# Patient Record
Sex: Female | Born: 1960 | Race: White | Hispanic: Yes | Marital: Married | Smoking: Current every day smoker
Health system: Southern US, Community
[De-identification: ages and names within clinical notes are randomized; demographics above are authoritative.]

## PROBLEM LIST (undated history)

## (undated) DIAGNOSIS — M1990 Unspecified osteoarthritis, unspecified site: Secondary | ICD-10-CM

## (undated) DIAGNOSIS — T7840XA Allergy, unspecified, initial encounter: Secondary | ICD-10-CM

## (undated) DIAGNOSIS — M8580 Other specified disorders of bone density and structure, unspecified site: Secondary | ICD-10-CM

## (undated) DIAGNOSIS — K219 Gastro-esophageal reflux disease without esophagitis: Secondary | ICD-10-CM

## (undated) DIAGNOSIS — K635 Polyp of colon: Secondary | ICD-10-CM

## (undated) DIAGNOSIS — K589 Irritable bowel syndrome without diarrhea: Secondary | ICD-10-CM

## (undated) HISTORY — DX: Gastro-esophageal reflux disease without esophagitis: K21.9

## (undated) HISTORY — DX: Polyp of colon: K63.5

## (undated) HISTORY — PX: WISDOM TOOTH EXTRACTION: SHX21

## (undated) HISTORY — DX: Other specified disorders of bone density and structure, unspecified site: M85.80

## (undated) HISTORY — DX: Unspecified osteoarthritis, unspecified site: M19.90

## (undated) HISTORY — DX: Allergy, unspecified, initial encounter: T78.40XA

## (undated) HISTORY — DX: Irritable bowel syndrome, unspecified: K58.9

---

## 1998-05-13 ENCOUNTER — Other Ambulatory Visit: Admission: RE | Admit: 1998-05-13 | Discharge: 1998-05-13 | Payer: Self-pay | Admitting: Obstetrics and Gynecology

## 1998-12-28 HISTORY — PX: UTERINE FIBROID SURGERY: SHX826

## 1999-03-20 ENCOUNTER — Inpatient Hospital Stay (HOSPITAL_COMMUNITY): Admission: EM | Admit: 1999-03-20 | Discharge: 1999-03-21 | Payer: Self-pay | Admitting: Obstetrics and Gynecology

## 2000-07-20 ENCOUNTER — Other Ambulatory Visit: Admission: RE | Admit: 2000-07-20 | Discharge: 2000-07-20 | Payer: Self-pay | Admitting: Obstetrics and Gynecology

## 2001-08-11 ENCOUNTER — Other Ambulatory Visit: Admission: RE | Admit: 2001-08-11 | Discharge: 2001-08-11 | Payer: Self-pay | Admitting: Obstetrics and Gynecology

## 2002-06-14 ENCOUNTER — Encounter (INDEPENDENT_AMBULATORY_CARE_PROVIDER_SITE_OTHER): Payer: Self-pay

## 2002-06-14 ENCOUNTER — Ambulatory Visit (HOSPITAL_COMMUNITY): Admission: RE | Admit: 2002-06-14 | Discharge: 2002-06-14 | Payer: Self-pay | Admitting: Obstetrics and Gynecology

## 2002-09-11 ENCOUNTER — Other Ambulatory Visit: Admission: RE | Admit: 2002-09-11 | Discharge: 2002-09-11 | Payer: Self-pay | Admitting: Obstetrics and Gynecology

## 2003-10-17 ENCOUNTER — Other Ambulatory Visit: Admission: RE | Admit: 2003-10-17 | Discharge: 2003-10-17 | Payer: Self-pay | Admitting: Obstetrics and Gynecology

## 2005-01-13 ENCOUNTER — Other Ambulatory Visit: Admission: RE | Admit: 2005-01-13 | Discharge: 2005-01-13 | Payer: Self-pay | Admitting: Obstetrics and Gynecology

## 2005-01-14 ENCOUNTER — Ambulatory Visit: Payer: Self-pay | Admitting: Internal Medicine

## 2005-02-17 ENCOUNTER — Ambulatory Visit: Payer: Self-pay | Admitting: Internal Medicine

## 2005-02-17 DIAGNOSIS — K648 Other hemorrhoids: Secondary | ICD-10-CM | POA: Insufficient documentation

## 2007-12-05 ENCOUNTER — Ambulatory Visit: Payer: Self-pay | Admitting: Internal Medicine

## 2008-01-03 DIAGNOSIS — M949 Disorder of cartilage, unspecified: Secondary | ICD-10-CM

## 2008-01-03 DIAGNOSIS — IMO0002 Reserved for concepts with insufficient information to code with codable children: Secondary | ICD-10-CM

## 2008-01-03 DIAGNOSIS — K589 Irritable bowel syndrome without diarrhea: Secondary | ICD-10-CM | POA: Insufficient documentation

## 2008-01-03 DIAGNOSIS — M899 Disorder of bone, unspecified: Secondary | ICD-10-CM | POA: Insufficient documentation

## 2008-01-23 ENCOUNTER — Encounter: Payer: Self-pay | Admitting: Internal Medicine

## 2008-01-23 ENCOUNTER — Ambulatory Visit: Payer: Self-pay | Admitting: Internal Medicine

## 2008-01-23 HISTORY — PX: COLONOSCOPY: SHX174

## 2008-10-01 ENCOUNTER — Ambulatory Visit (HOSPITAL_COMMUNITY): Admission: RE | Admit: 2008-10-01 | Discharge: 2008-10-01 | Payer: Self-pay | Admitting: Specialist

## 2010-02-21 ENCOUNTER — Encounter: Admission: RE | Admit: 2010-02-21 | Discharge: 2010-02-21 | Payer: Self-pay | Admitting: Internal Medicine

## 2010-06-23 ENCOUNTER — Encounter: Admission: RE | Admit: 2010-06-23 | Discharge: 2010-06-23 | Payer: Self-pay | Admitting: Ophthalmology

## 2010-12-28 HISTORY — PX: OTHER SURGICAL HISTORY: SHX169

## 2011-05-12 NOTE — Assessment & Plan Note (Signed)
[facility]                         GASTROENTEROLOGY OFFICE NOTE   PAHOLA, DIMMITT                      MRN:          [id number]  DATE:12/05/2007                            DOB:          1961/06/21    REASON FOR EVALUATION:  Rectal bleeding.   HISTORY:  This is a 50 year old white female with no significant medical  history who presents today regarding rectal bleeding. The patient was  evaluated in January of 2006 for rectal bleeding. She subsequently  underwent complete colonoscopy February 17, 2005. This was normal. She  did have internal hemorrhoids. She was felt to have irritable bowel and  was placed on fiber and Levsin. The patient reports to me that she has  had significant rectal bleeding over the past several months. This is  different than the bleeding that she had had previously. In particular,  she states that it is more voluminous. Also, she notices blood  associated with the stool as well as blood without passing a stool.  There has been no associated rectal or abdominal pain. She has had 26  pound weight loss dating back to last March. However, she tells me the  bulk of the weight loss was between March and May. She has had some  stress in her family including the recent death of her father. She saw  Dr. Arelia Sneddon last month. Rectal examination was reportedly negative. She  is now referred.   PAST MEDICAL HISTORY:  Unremarkable.   PAST SURGICAL HISTORY:  Uterine fibroid removal.   ALLERGIES:  No known drug allergies.   CURRENT MEDICATIONS:  1. Calcium and vitamin D.  2. Motrin p.r.n.   FAMILY HISTORY:  Unknown as the patient is adopted.   SOCIAL HISTORY:  The patient is married without children. 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 She smokes.   PHYSICAL EXAMINATION:  Well-appearing female in no acute distress. Blood  pressure 90/56, heart rate 76, weight 109 pounds (decreased 17 pounds  since her last visit).  HEENT: Sclerae anicteric. Oral mucosa intact.  LUNGS:  Are clear.  HEART: Is regular.  ABDOMEN: Soft without tenderness, mass or hernia. Good bowel sounds  heard.  RECTAL: Was not repeated.  EXTREMITIES: Are without edema.   IMPRESSION:  This is a 50 year old female who presents with rectal  bleeding different than that of three years ago. Also, weight loss,  possibly related to stress, though uncertain. At this point, I think it  is reasonable to repeat her colonoscopy to make sure there is no  significant pathology to explain the bleeding and weight loss. If she is  found to have internal hemorrhoids again, then medical therapy will be  initiated.     Wilhemina Bonito. Marina Goodell, MD  Electronically Signed    JNP/MedQ  DD: 12/05/2007  DT: 12/05/2007  Job #: [id number]   cc:   Juluis Mire, M.D.  Gwen Pounds, MD

## 2011-05-15 NOTE — Op Note (Signed)
[facility]  Patient:    Emily Meza, Emily Meza Visit Number: [id number] MRN: [id number]          Service Type: DSU Location: [facility] Attending Physician:  Frederich Balding Dictated by:   Juluis Mire, M.D. Proc. Date: 06/14/02 Admit Date:  06/14/2002                             Operative Report  PREOPERATIVE DIAGNOSES:       Nonviable first trimester pregnancy.  POSTOPERATIVE DIAGNOSES:      Nonviable first trimester pregnancy.  OPERATIVE PROCEDURE:          Paracervical block, cervical dilatation, uterine evacuation.  SURGEON:                      Juluis Mire, M.D.  ANESTHESIA:                   Sedation with paracervical block.  ESTIMATED BLOOD LOSS:         100 cc.  PACKS AND DRAINS:             None.  INTRAOPERATIVE BLOOD PLACED:  None.  COMPLICATIONS:                None.  INDICATIONS:                  Dictated in history and physical.  PROCEDURE AS FOLLOWS:         The patient was taken to the OR and placed in supine position.  After sedation the patient was placed in the dorsal supine position using the Allen stirrups.  The patient was then draped out.  Speculum was placed in the vaginal vault.  The cervix and vagina were cleansed out with Betadine.  Cervix was grasped with a single tooth tenaculum.  Paracervical block was then instituted using 1% Xylocaine.  Uterus sounded to approximately 11 cm.  Cervix was serially dilated to a size 29 Pratt dilator.  A size 8 curved suction curette was introduced.  Contents were removed using suction curetted.  We then sharply curetted the intrauterine cavity.  Further tissue obtained.  We repeated suction curetting and sharp curetting until there was no further tissue.  At this point in time we felt all quadrants were clear by their gritty feel with the sharp curetting.  The uterus was retracting down well and there was minimal bleeding.  Adequate amounts of tissue were obtained and they  were sent for pathologic review.  The single tooth tenaculum and speculum were then removed.  The patient was taken out of the dorsal lithotomy position, once alert, transferred to recovery room in good condition.  Sponge, instrument, and needle count reported as correct by circulating nurse. Dictated by:   Juluis Mire, M.D. Attending Physician:  Frederich Balding DD:  06/14/02 TD:  06/15/02 Job: [id number]

## 2011-05-15 NOTE — H&P (Signed)
[facility]  Patient:    Emily Meza, Emily Meza Visit Number: [id number] MRN: [id number]          Service Type: DSU Location: [facility] Attending Physician:  Frederich Balding Dictated by:   Juluis Mire, M.D. Admit Date:  06/14/2002                           History and Physical  HISTORY OF PRESENT ILLNESS:   The patient is a 50 year old primigravida married white female, last menstrual period of March 24, 2002 giving her an estimated date of confinement of December 30, 2002.  By dates she is approximately 11 weeks.  She was seen in the office yesterday complaining of first trimester bleeding.  Ultrasound revealed an intrauterine gestational sac consistent with eight weeks and six days.  There was a crown-rump length but no fetal heart was noted.  This is consistent with a nonviable pregnancy and the patient presents at the present time to undergo dilatation and evacuation. Blood type is O positive; RhoGAM will not be required.  ALLERGIES:                    No known drug allergies.  MEDICATIONS:                  Vitamins.  PAST MEDICAL HISTORY:         Usual childhood diseases with no significant sequelae.  PAST SURGICAL HISTORY:        She had a myomectomy done in 2000.  FAMILY HISTORY:               Noncontributory.  SOCIAL HISTORY:               Does reveal tobacco but no alcohol use.  REVIEW OF SYSTEMS:            Noncontributory.  PHYSICAL EXAMINATION:  VITAL SIGNS:                  The patient is afebrile with stable vital signs.  HEENT:                        The patient is normocephalic.  Pupils equal, round, and reactive to light and accommodation.  Extraocular movements were intact.  Sclerae and conjunctivae were clear.  Oropharynx clear.  NECK:                         Without thyromegaly.  BREASTS:                      Not examined.  LUNGS:                        Clear.  CARDIOVASCULAR:               Regular rhythm and rate  without murmurs or gallops.  ABDOMEN:                      Benign.  No masses, organomegaly, or tenderness.  PELVIC:                       Uterus is posterior, nine weeks in size, moderate bleeding noted.  Adnexa unremarkable.  EXTREMITIES:  Trace edema.  NEUROLOGIC:                   Grossly within normal limits.  IMPRESSION:                   Nonviable first trimester pregnancy.  PLAN:                         The patient to undergo dilatation and evacuation.  The risks of surgery have been discussed, including the risk of infection; the risk of hemorrhage that could require transfusion or possible hysterectomy; the risk of uterine perforation that could lead to injury to adjacent organs requiring laparoscopy with possible exploratory laparotomy; the risk of deep venous thrombosis and pulmonary embolus.  The patient professed an understanding of the indications and risks. Dictated by:   Juluis Mire, M.D. Attending Physician:  Frederich Balding DD:  06/14/02 TD:  06/14/02 Job: [id number]

## 2011-12-09 ENCOUNTER — Other Ambulatory Visit: Payer: Self-pay | Admitting: Neurosurgery

## 2011-12-09 DIAGNOSIS — M47812 Spondylosis without myelopathy or radiculopathy, cervical region: Secondary | ICD-10-CM

## 2011-12-17 ENCOUNTER — Ambulatory Visit
Admission: RE | Admit: 2011-12-17 | Discharge: 2011-12-17 | Disposition: A | Discharge status: Home / Self Care | Payer: BC Managed Care – PPO | Source: Ambulatory Visit | Attending: Neurosurgery | Admitting: Neurosurgery

## 2011-12-17 ENCOUNTER — Other Ambulatory Visit: Payer: Self-pay | Admitting: Neurosurgery

## 2011-12-17 DIAGNOSIS — M47812 Spondylosis without myelopathy or radiculopathy, cervical region: Secondary | ICD-10-CM

## 2012-01-12 ENCOUNTER — Other Ambulatory Visit: Payer: Self-pay | Admitting: Neurosurgery

## 2012-01-12 DIAGNOSIS — M47812 Spondylosis without myelopathy or radiculopathy, cervical region: Secondary | ICD-10-CM

## 2012-01-19 ENCOUNTER — Ambulatory Visit
Admission: RE | Admit: 2012-01-19 | Discharge: 2012-01-19 | Disposition: A | Discharge status: Home / Self Care | Payer: BC Managed Care – PPO | Source: Ambulatory Visit | Attending: Neurosurgery | Admitting: Neurosurgery

## 2012-01-19 DIAGNOSIS — M542 Cervicalgia: Secondary | ICD-10-CM

## 2012-01-19 DIAGNOSIS — M47812 Spondylosis without myelopathy or radiculopathy, cervical region: Secondary | ICD-10-CM

## 2012-01-19 MED ORDER — IOHEXOL 300 MG/ML  SOLN
1.0000 mL | Freq: Once | INTRAMUSCULAR | Status: AC | PRN
  Administered 2012-01-19: 1 mL via EPIDURAL

## 2012-01-19 MED ORDER — TRIAMCINOLONE ACETONIDE 40 MG/ML IJ SUSP (RADIOLOGY)
60.0000 mg | Freq: Once | INTRAMUSCULAR | Status: AC
  Administered 2012-01-19: 60 mg via EPIDURAL

## 2012-01-19 NOTE — Progress Notes (Signed)
IV d/c post cervical injection, it was stated pre procedure by A. Rice RT.   Site was unremarkable and catheter was intact.

## 2017-02-12 ENCOUNTER — Other Ambulatory Visit: Payer: Self-pay | Admitting: Neurosurgery

## 2017-02-12 DIAGNOSIS — M4712 Other spondylosis with myelopathy, cervical region: Secondary | ICD-10-CM

## 2017-02-22 ENCOUNTER — Ambulatory Visit
Admission: RE | Admit: 2017-02-22 | Discharge: 2017-02-22 | Disposition: A | Discharge status: Home / Self Care | Payer: No Typology Code available for payment source | Source: Ambulatory Visit | Attending: Neurosurgery | Admitting: Neurosurgery

## 2017-02-22 DIAGNOSIS — M4712 Other spondylosis with myelopathy, cervical region: Secondary | ICD-10-CM

## 2017-08-09 ENCOUNTER — Ambulatory Visit
Admission: RE | Admit: 2017-08-09 | Discharge: 2017-08-09 | Disposition: A | Discharge status: Home / Self Care | Payer: Self-pay | Source: Ambulatory Visit | Attending: Neurosurgery | Admitting: Neurosurgery

## 2017-08-09 ENCOUNTER — Other Ambulatory Visit: Payer: Self-pay | Admitting: Neurosurgery

## 2017-08-09 DIAGNOSIS — M25812 Other specified joint disorders, left shoulder: Secondary | ICD-10-CM

## 2017-08-10 ENCOUNTER — Other Ambulatory Visit: Payer: Self-pay | Admitting: Neurosurgery

## 2017-08-10 DIAGNOSIS — M25812 Other specified joint disorders, left shoulder: Secondary | ICD-10-CM

## 2017-08-15 ENCOUNTER — Ambulatory Visit
Admission: RE | Admit: 2017-08-15 | Discharge: 2017-08-15 | Disposition: A | Discharge status: Home / Self Care | Payer: Self-pay | Source: Ambulatory Visit | Attending: Neurosurgery | Admitting: Neurosurgery

## 2017-08-15 DIAGNOSIS — M25812 Other specified joint disorders, left shoulder: Secondary | ICD-10-CM

## 2018-02-16 ENCOUNTER — Encounter: Payer: Self-pay | Admitting: Internal Medicine

## 2018-04-12 IMAGING — MR MR SHOULDER*L* W/O CM
4 of 5 series · 14 of 40 positions shown · non-contrast
Comparison: None.

CLINICAL DATA: Left shoulder pain when trying to lower arm for 4
weeks. Episodic numbness and weakness.

EXAM:
MRI OF THE LEFT SHOULDER WITHOUT CONTRAST
TECHNIQUE: Multiplanar, multisequence MR imaging of the shoulder was performed.
No intravenous contrast was administered.

[Series 6: T2 fat-sat · axial · left · 3.0mm · 0.44mm/px · z∈[-55,+13]mm · 3 of 25 slices shown (1 of 3)]
[im 3/25]
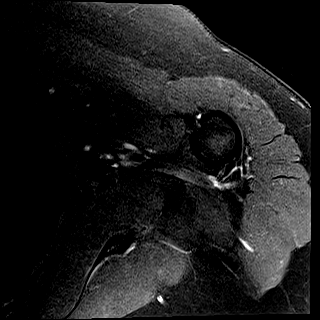
[im 14/25]
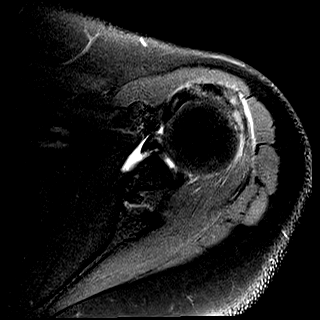
[im 22/25]
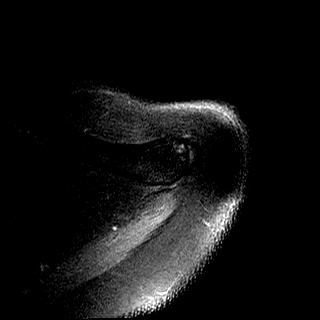

[Series 7: T2 fat-sat · coronal · left · 3.0mm · 0.44mm/px · 3 of 21 slices shown (2 of 3)]
[im 3/21]
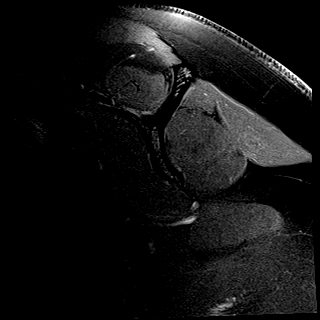
[im 12/21]
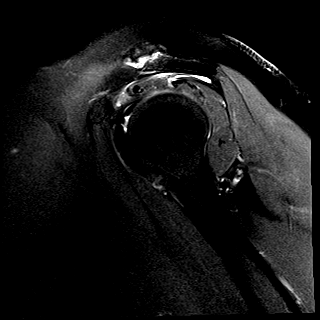
[im 18/21]
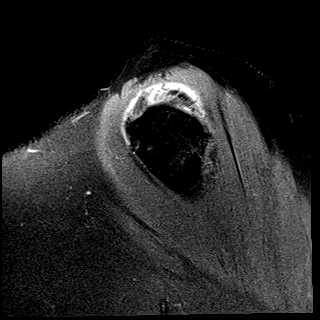

[Series 9: PD · oblique · left · 3.0mm · 0.18mm/px · 5 of 18 slices shown]
[im 1/18]
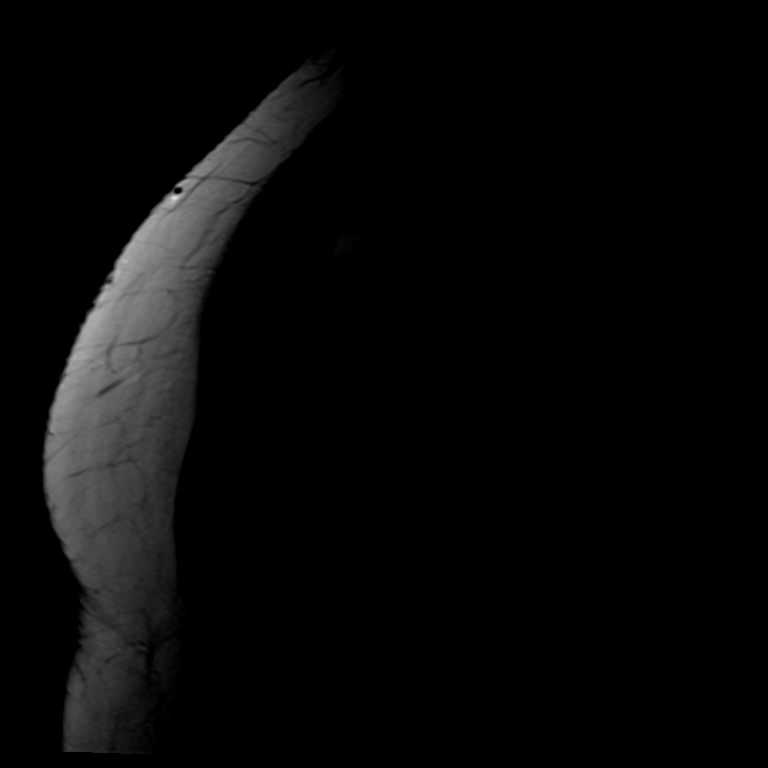
[im 3/18]
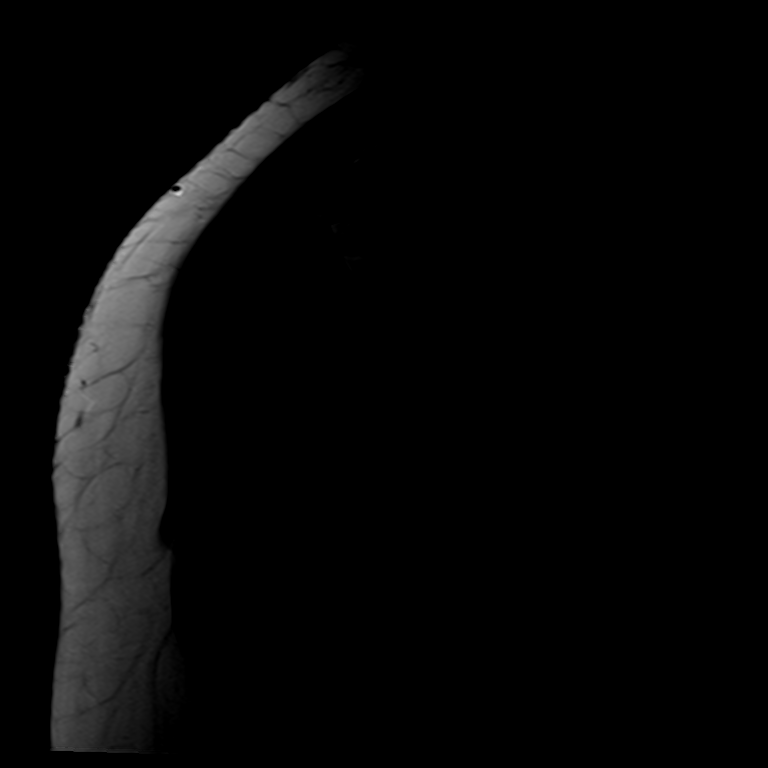
[im 6/18]
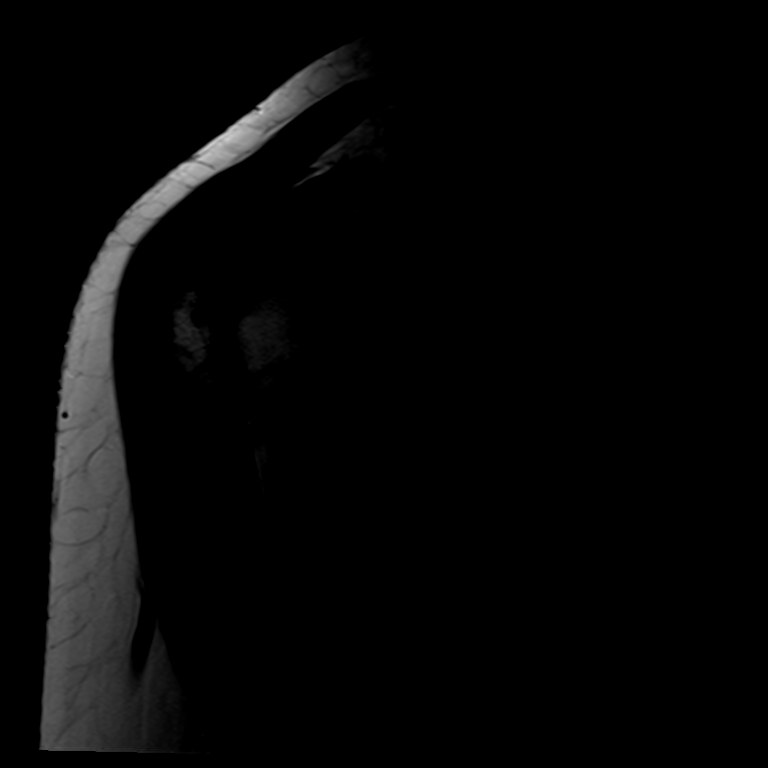
[im 9/18]
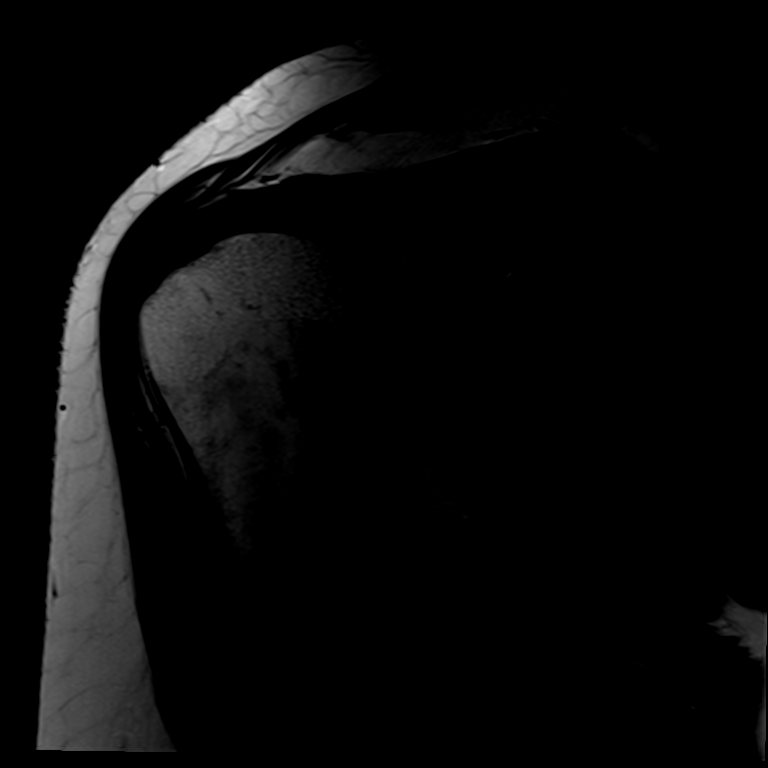
[im 15/18]
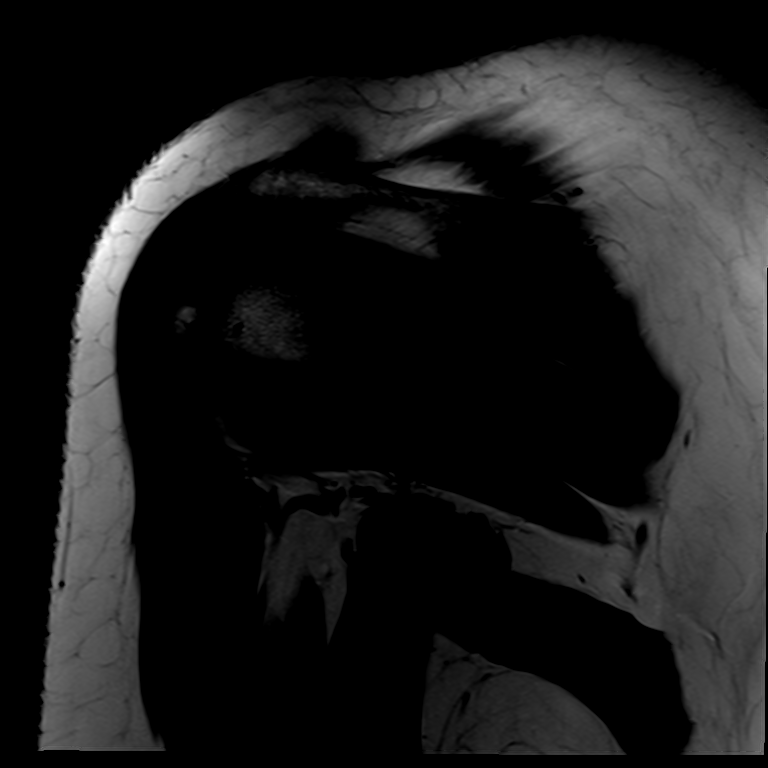

[Series 10: T2 fat-sat · oblique · left · 3.0mm · 0.22mm/px · 3 of 18 slices shown (3 of 3)]
[im 3/18]
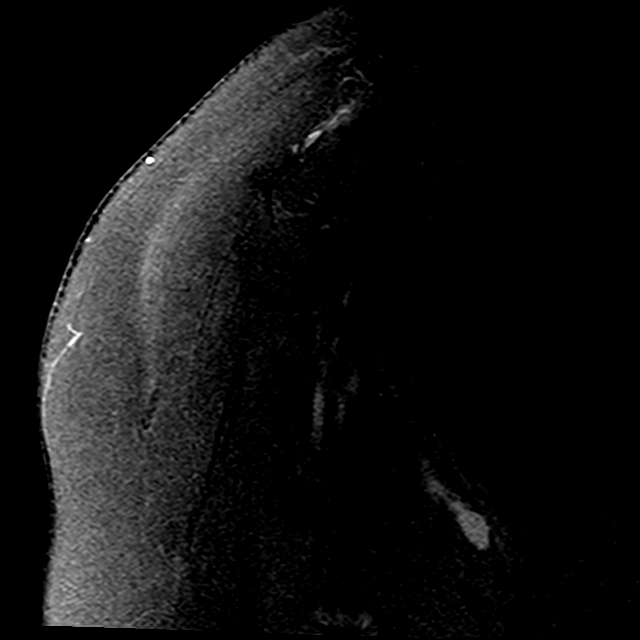
[im 9/18]
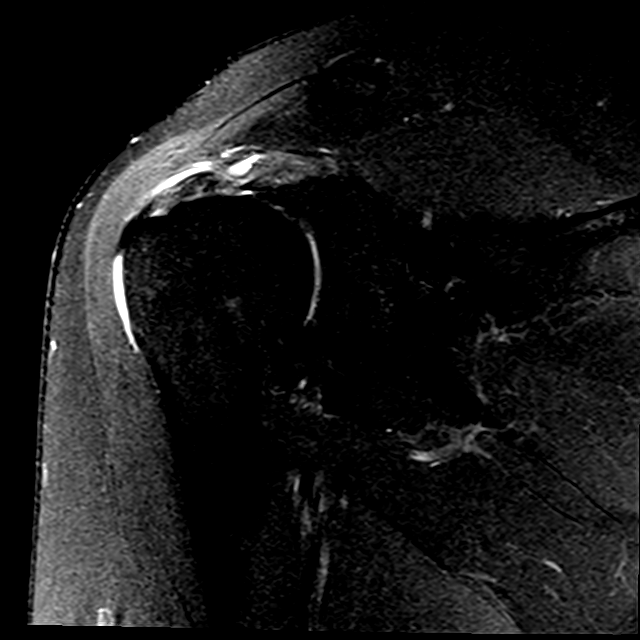
[im 15/18]
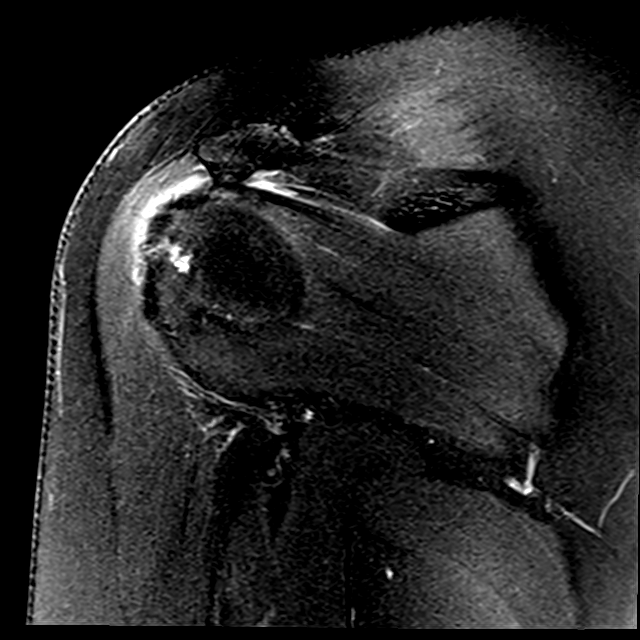

[14 of 40 positions shown; findings below may reference images not displayed]

FINDINGS: Rotator cuff: There is thickening of the supraspinatus and
infraspinatus tendons consistent with a moderate degree of
tendinosis. Minimal bursal surface fraying is noted of the
infraspinatus tendon. Intact subscapularis and teres minor.

Muscles: No atrophy or abnormal signal of the muscles of the rotator
cuff.

Biceps long head:  Intact.

Acromioclavicular Joint: Moderate arthropathy of the
acromioclavicular joint with reactive subchondral marrow edema and
cystic change. Type I acromion. Small amount of subacromial and
subdeltoid bursal fluid.

Glenohumeral Joint: No joint effusion. No chondral defect.

Labrum: Grossly intact, but evaluation is limited by lack of
intraarticular fluid.

Bones: No marrow abnormality, fracture or dislocation. Tiny
subcortical cysts of the humeral head posteriorly.

Other: Mild edema and slight irregular thickening of the cortical
clavicular ligament may represent a mild ligamentous sprain, series
7, image 7.
IMPRESSION: 1. Supraspinatus and infraspinatus tendinosis with minimal bursal
surface fraying of the infraspinatus tendon. Associated subacromial
subdeltoid bursitis.
2. Moderate degree of AC joint osteoarthritis with capsular bulging
and osseous spurring but without significant impingement upon the
myotendinous junction of the supraspinatus.
3. Mild irregular thickening of the acromioclavicular ligament with
surrounding edema, suspicious for ligamentous sprain.

## 2018-04-25 ENCOUNTER — Other Ambulatory Visit: Payer: Self-pay

## 2018-04-25 ENCOUNTER — Ambulatory Visit (AMBULATORY_SURGERY_CENTER): Payer: Self-pay | Admitting: *Deleted

## 2018-04-25 VITALS — Ht 61.0 in | Wt 154.6 lb

## 2018-04-25 DIAGNOSIS — Z1211 Encounter for screening for malignant neoplasm of colon: Secondary | ICD-10-CM

## 2018-04-25 MED ORDER — PEG-KCL-NACL-NASULF-NA ASC-C 140 G PO SOLR
1.0000 | ORAL | 0 refills | Status: DC
Start: 2018-04-25 — End: 2018-05-09

## 2018-04-25 NOTE — Progress Notes (Signed)
No egg or soy allergy known to patient  No issues with past sedation with any surgeries  or procedures, no intubation problems  No diet pills per patient No home 02 use per patient  No blood thinners per patient  Pt denies issues with constipation  No A fib or A flutter  EMMI video sent to pt's e mail - pt declined  plenvu sample as pt is self pay-  Lot [id number]  Exp 6-20 as directed

## 2018-05-09 ENCOUNTER — Other Ambulatory Visit: Payer: Self-pay

## 2018-05-09 ENCOUNTER — Ambulatory Visit (AMBULATORY_SURGERY_CENTER): Payer: Self-pay | Admitting: Internal Medicine

## 2018-05-09 ENCOUNTER — Encounter: Payer: Self-pay | Admitting: Internal Medicine

## 2018-05-09 VITALS — BP 89/71 | HR 74 | Temp 96.6°F | Resp 16 | Ht 61.0 in | Wt 154.0 lb

## 2018-05-09 DIAGNOSIS — Z1211 Encounter for screening for malignant neoplasm of colon: Secondary | ICD-10-CM

## 2018-05-09 DIAGNOSIS — K635 Polyp of colon: Secondary | ICD-10-CM

## 2018-05-09 DIAGNOSIS — D125 Benign neoplasm of sigmoid colon: Secondary | ICD-10-CM

## 2018-05-09 MED ORDER — SODIUM CHLORIDE 0.9 % IV SOLN: 500.0000 mL | Freq: Once | INTRAVENOUS | Status: AC

## 2018-05-09 NOTE — Progress Notes (Signed)
Report to PACU, RN, vss, BBS= Clear.  

## 2018-05-09 NOTE — Patient Instructions (Signed)
Thank you for allowing [place] to care for you today!!  Handout given for Polyps    YOU HAD AN ENDOSCOPIC PROCEDURE TODAY AT [facility]:   Refer to the procedure report that was given to you for any specific questions about what was found during the examination.  If the procedure report does not answer your questions, please call your gastroenterologist to clarify.  If you requested that your care partner not be given the details of your procedure findings, then the procedure report has been included in a sealed envelope for you to review at your convenience later.  YOU SHOULD EXPECT: Some feelings of bloating in the abdomen. Passage of more gas than usual.  Walking can help get rid of the air that was put into your GI tract during the procedure and reduce the bloating. If you had a lower endoscopy (such as a colonoscopy or flexible sigmoidoscopy) you may notice spotting of blood in your stool or on the toilet paper. If you underwent a bowel prep for your procedure, you may not have a normal bowel movement for a few days.  Please Note:  You might notice some irritation and congestion in your nose or some drainage.  This is from the oxygen used during your procedure.  There is no need for concern and it should clear up in a day or so.  SYMPTOMS TO REPORT IMMEDIATELY:   Following lower endoscopy (colonoscopy or flexible sigmoidoscopy):  Excessive amounts of blood in the stool  Significant tenderness or worsening of abdominal pains  Swelling of the abdomen that is new, acute  Fever of 100F or higher    For urgent or emergent issues, a gastroenterologist can be reached at any hour by calling [phone].   DIET:  We do recommend a small meal at first, but then you may proceed to your regular diet.  Drink plenty of fluids but you should avoid alcoholic beverages for 24 hours.  ACTIVITY:  You should plan to take it easy for the rest of today and you should NOT DRIVE or use  heavy machinery until tomorrow (because of the sedation medicines used during the test).    FOLLOW UP: Our staff will call the number listed on your records the next business day following your procedure to check on you and address any questions or concerns that you may have regarding the information given to you following your procedure. If we do not reach you, we will leave a message.  However, if you are feeling well and you are not experiencing any problems, there is no need to return our call.  We will assume that you have returned to your regular daily activities without incident.  If any biopsies were taken you will be contacted by phone or by letter within the next 1-3 weeks.  Please call us at [phone] if you have not heard about the biopsies in 3 weeks.    SIGNATURES/CONFIDENTIALITY: You and/or your care partner have signed paperwork which will be entered into your electronic medical record.  These signatures attest to the fact that that the information above on your After Visit Summary has been reviewed and is understood.  Full responsibility of the confidentiality of this discharge information lies with you and/or your care-partner.

## 2018-05-09 NOTE — Progress Notes (Signed)
Called to room to assist during endoscopic procedure.  Patient ID and intended procedure confirmed with present staff. Received instructions for my participation in the procedure from the performing physician.  

## 2018-05-09 NOTE — Op Note (Signed)
[facility] Patient Name: Emily Meza Procedure Date: 05/09/2018 9:40 AM MRN: [id number] Endoscopist: Docia Chuck. Henrene Pastor , MD Age: 57 Referring MD:  Date of Birth: 1961/11/02 Gender: Female Account #: [id number] Procedure:                Colonoscopy, with cold snare polypectomy x 1 Indications:              Screening for colorectal malignant neoplasm.                            Previous examinations 2006 and 2009 negative for                            neoplasia Medicines:                Monitored Anesthesia Care Procedure:                Pre-Anesthesia Assessment:                           - Prior to the procedure, a History and Physical                            was performed, and patient medications and                            allergies were reviewed. The patient's tolerance of                            previous anesthesia was also reviewed. The risks                            and benefits of the procedure and the sedation                            options and risks were discussed with the patient.                            All questions were answered, and informed consent                            was obtained. Prior Anticoagulants: The patient has                            taken no previous anticoagulant or antiplatelet                            agents. ASA Grade Assessment: I - A normal, healthy                            patient. After reviewing the risks and benefits,                            the patient was deemed in satisfactory condition to  undergo the procedure.                           After obtaining informed consent, the colonoscope                            was passed under direct vision. Throughout the                            procedure, the patient's blood pressure, pulse, and                            oxygen saturations were monitored continuously. The                            Colonoscope was introduced through  the anus and                            advanced to the the cecum, identified by                            appendiceal orifice and ileocecal valve. The                            ileocecal valve, appendiceal orifice, and rectum                            were photographed. The quality of the bowel                            preparation was excellent. The colonoscopy was                            performed without difficulty. The patient tolerated                            the procedure well. The bowel preparation used was                            SUPREP. Scope In: 9:52:01 AM Scope Out: 10:06:32 AM Scope Withdrawal Time: 0 hours 12 minutes 22 seconds  Total Procedure Duration: 0 hours 14 minutes 31 seconds  Findings:                 A 3 mm polyp was found in the sigmoid colon. The                            polyp was removed with a cold snare. Resection and                            retrieval were complete.                           Internal hemorrhoids were found during retroflexion.  The exam was otherwise without abnormality on                            direct and retroflexion views. Complications:            No immediate complications. Estimated blood loss:                            None. Estimated Blood Loss:     Estimated blood loss: none. Impression:               - One 3 mm polyp in the sigmoid colon, removed with                            a cold snare. Resected and retrieved.                           - Internal hemorrhoids.                           - The examination was otherwise normal on direct                            and retroflexion views. Recommendation:           - Repeat colonoscopy in 5-10 years for surveillance.                           - Patient has a contact number available for                            emergencies. The signs and symptoms of potential                            delayed complications were discussed with the                             patient. Return to normal activities tomorrow.                            Written discharge instructions were provided to the                            patient.                           - Resume previous diet.                           - Continue present medications.                           - Await pathology results. Docia Chuck. Henrene Pastor, MD 05/09/2018 10:12:53 AM This report has been signed electronically.

## 2018-05-09 NOTE — Progress Notes (Signed)
Pt's states no medical or surgical changes since previsit or office visit. 

## 2018-05-10 ENCOUNTER — Telehealth: Payer: Self-pay | Admitting: *Deleted

## 2018-05-10 NOTE — Telephone Encounter (Signed)
Opened in error

## 2018-05-10 NOTE — Telephone Encounter (Signed)
  Follow up Call-  Call back number 05/09/2018  Post procedure Call Back phone  # [phone]  Permission to leave phone message Yes  Some recent data might be hidden     Patient questions:  Do you have a fever, pain , or abdominal swelling? No. Pain Score  0 *  Have you tolerated food without any problems? Yes.    Have you been able to return to your normal activities? Yes.    Do you have any questions about your discharge instructions: Diet   No. Medications  No. Follow up visit  No.  Do you have questions or concerns about your Care? No.  Actions: * If pain score is 4 or above: No action needed, pain <4.

## 2018-05-10 NOTE — Telephone Encounter (Signed)
No answer, left message to call if questions or concerns. 

## 2018-05-16 ENCOUNTER — Encounter: Payer: Self-pay | Admitting: Internal Medicine

## 2019-08-14 ENCOUNTER — Other Ambulatory Visit: Payer: Self-pay

## 2019-08-14 DIAGNOSIS — Z20822 Contact with and (suspected) exposure to covid-19: Secondary | ICD-10-CM

## 2019-08-16 LAB — NOVEL CORONAVIRUS, NAA: SARS-CoV-2, NAA: NOT DETECTED

## 2019-08-16 LAB — SPECIMEN STATUS REPORT

## 2019-12-14 ENCOUNTER — Encounter (HOSPITAL_BASED_OUTPATIENT_CLINIC_OR_DEPARTMENT_OTHER): Payer: Self-pay | Admitting: *Deleted

## 2019-12-14 ENCOUNTER — Other Ambulatory Visit: Payer: Self-pay

## 2019-12-14 ENCOUNTER — Emergency Department (HOSPITAL_BASED_OUTPATIENT_CLINIC_OR_DEPARTMENT_OTHER)
Admission: EM | Admit: 2019-12-14 | Discharge: 2019-12-14 | Disposition: A | Discharge status: Home / Self Care | Payer: BC Managed Care – PPO | Attending: Emergency Medicine | Admitting: Emergency Medicine

## 2019-12-14 DIAGNOSIS — M542 Cervicalgia: Secondary | ICD-10-CM

## 2019-12-14 DIAGNOSIS — F17210 Nicotine dependence, cigarettes, uncomplicated: Secondary | ICD-10-CM | POA: Diagnosis not present

## 2019-12-14 MED ORDER — METHOCARBAMOL 500 MG PO TABS: 500.0000 mg | ORAL_TABLET | Freq: Three times a day (TID) | ORAL | 0 refills | Status: AC | PRN

## 2019-12-14 MED ORDER — PREDNISONE 10 MG (21) PO TBPK: ORAL_TABLET | Freq: Every day | ORAL | 0 refills | Status: AC

## 2019-12-14 NOTE — ED Triage Notes (Signed)
Pt c/o neck pain which  radiates down to right elbow x 3 days. Report no relief with motrin

## 2019-12-14 NOTE — Discharge Instructions (Addendum)
You were seen in the ER today for neck pain going into your right arm with numbness.   We are sending you home with the following medicines:  - Prednisone- this is a steroid medication that will help with pain and swelling as well as nerve irritation. Be sure to take this medication as prescribed with food in the morning.  It should be taken with food, as it can cause stomach upset, and more seriously, stomach bleeding. Do not take nonsteroidal anti-inflammatory medications with this such as Advil, Motrin, Aleve, Mobic, Goodie Powder, or Motrin as it can further irritate your stomach. We instruct that you take this in the morning to avoid disturbance of sleep schedule.    - Robaxin is the muscle relaxer I have prescribed, this is meant to help with muscle tightness. Be aware that this medication may make you drowsy therefore the first time you take this it should be at a time you are in an environment where you can rest. Do not drive or operate heavy machinery when taking this medication. Do not drink alcohol or take other sedating medications with this medicine such as narcotics or benzodiazepines.   You make take Tylenol per over the counter dosing with these medications.   We have prescribed you new medication(s) today. Discuss the medications prescribed today with your pharmacist as they can have adverse effects and interactions with your other medicines including over the counter and prescribed medications. Seek medical evaluation if you start to experience new or abnormal symptoms after taking one of these medicines, seek care immediately if you start to experience difficulty breathing, feeling of your throat closing, facial swelling, or rash as these could be indications of a more serious allergic reaction  Please apply heat to the area.  Please follow up with Dr. Ellene Route, the neurosurgeon in your discharge instructions, or your primary care provider within 3 days. Return to the ED for new or  worsening symptoms including but not limited to increased pain, constant numbness, spreading numbness, weakness/dropping items, fever, loss of control of bowel/bladder function, or any other concerns.

## 2019-12-14 NOTE — ED Provider Notes (Signed)
[facility] EM[facility]ENC[facility] DEPARTMENT Provider Note   CSN: [id number] Arrival date & time: 12/14/19  1826     History Chief Complaint  Patient presents with  . Neck Pain    Emily Meza is a 58 y.o. female with a history of IBS and GERD who presents to the emergency department with complaints of neck pain radiating into the RUE x 1 week.  Patient states she has a longstanding history of issues with neck discomfort, she has known herniated disc and bone spurs, she was previously seeing neurosurgeon Dr. Carloyn Manner, she has undergone steroid injections as well as cervical traction to avoid surgery in the past.  She states over the past week she has noticed an increased pain in the neck that radiates into the right upper extremity with intermittent paresthesias to the right hand.  She states the symptoms seem worse when she is not utilizing the right upper extremity.  She is right-hand dominant.  She works as a [employer] park manager.  She has had no significant change in activity or traumatic injuries.  She states that Dr. Carloyn Manner retired and she has not started seeing a new neurosurgeon yet.  He had suggested Dr. Ellene Route. Denies weakness, saddle anesthesia, incontinence to bowel/bladder, fever, chills, IV drug use, dysuria, or hx of cancer. Patient has not had prior neck surgeries.    HPI     Past Medical History:  Diagnosis Date  . Allergy   . Arthritis    thumb left ,  shoulder left   . GERD (gastroesophageal reflux disease)   . Hyperplastic colon polyp    01-23-2008 x 1 -[place]   . IBS (irritable bowel syndrome)   . Osteopenia     Patient Active Problem List   Diagnosis Date Noted  . IBS 01/03/2008  . HERNIATED DISC 01/03/2008  . OSTEOPENIA 01/03/2008  . HEMORRHOIDS, INTERNAL 02/17/2005    Past Surgical History:  Procedure Laterality Date  . COLONOSCOPY  01/23/2008   [facility] x 1  . cyst removed from back  2012    Dr Carloyn Manner  . UTERINE FIBROID SURGERY  2000  . WISDOM TOOTH EXTRACTION        OB History   No obstetric history on file.     Family History  Adopted: Yes    Social History   Tobacco Use  . Smoking status: Current Every Day Smoker    Packs/day: 0.50  . Smokeless tobacco: Never Used  . Tobacco comment: 0.5-1 pack   Substance Use Topics  . Alcohol use: Yes    Comment: occasionally   . Drug use: Not Currently    Home Medications Prior to Admission medications   Medication Sig Start Date End Date Taking? Authorizing Provider  cholecalciferol (VITAMIN D) 1000 units tablet Take 1,000 Units by mouth daily.    [provider]  ibuprofen (ADVIL,MOTRIN) 200 MG tablet Take by mouth.    [provider]    Allergies    Patient has no known allergies.  Review of Systems   Review of Systems  Constitutional: Negative for chills and fever.  Respiratory: Negative for shortness of breath.   Cardiovascular: Negative for chest pain.  Gastrointestinal: Negative for abdominal pain, nausea and vomiting.  Musculoskeletal: Positive for arthralgias and neck pain.  Neurological: Negative for weakness.       Positive for intermittent right hand paresthesias.  Negative for incontinence or saddle anesthesia.    Physical Exam Updated Vital Signs BP 134/77   Pulse 98  Temp 98.7 F (37.1 C)   Resp 18   Ht 5\' 1"  (1.549 m)   Wt 68 kg   SpO2 100%   BMI 28.34 kg/m   Physical Exam Vitals and nursing note reviewed.  Constitutional:      General: She is not in acute distress.    Appearance: Normal appearance. She is not ill-appearing or toxic-appearing.  HENT:     Head: Normocephalic and atraumatic.  Neck:     Comments: No point/focal vertebral tenderness.  Right sided paraspinal muscle tenderness, specifically tender over the right trapezius muscle. Cardiovascular:     Rate and Rhythm: Normal rate.     Pulses:          Radial pulses are 2+ on the right side and 2+ on the left side.  Pulmonary:     Effort: No respiratory distress.      Breath sounds: Normal breath sounds.  Musculoskeletal:     Cervical back: Neck supple.     Comments: Upper extremities: No obvious deformity, appreciable swelling, edema, erythema, ecchymosis, warmth, or open wounds. Patient has intact AROM throughout.  No point/focal bony tenderness to palpation.  Compartments are soft.  Skin:    General: Skin is warm and dry.     Capillary Refill: Capillary refill takes less than 2 seconds.  Neurological:     Mental Status: She is alert.     Comments: Alert. Clear speech. Sensation grossly intact to bilateral upper extremities-able to discriminate sharp versus dull bilaterally.. 5/5 symmetric strength with shoulder flexion/extension, elbow flexion/extension, wrist flexion/extension, and grip strength.  Able to perform okay sign, thumbs up, and cross second/third digits bilaterally ambulatory.   Psychiatric:        Mood and Affect: Mood normal.        Behavior: Behavior normal.    ED Results / Procedures / Treatments   Labs (all labs ordered are listed, but only abnormal results are displayed) Labs Reviewed - No data to display  EKG None  Radiology No results found.  Procedures Procedures (including critical care time)  Medications Ordered in ED Medications - No data to display  ED Course  I have reviewed the triage vital signs and the nursing notes.  Pertinent labs & imaging results that were available during my care of the patient were reviewed by me and considered in my medical decision making (see chart for details).  Prior results reviewed:  MRI C spine 02/22/2017: IMPRESSION: 1. Mildly progressive spondylosis at C4-5 and C5-6 compared with prior study from 2012. At both levels, there is mildly increased foraminal narrowing which could affect the exiting nerve roots. 2. New mild right foraminal narrowing at C3-4 with potential right C4 nerve root encroachment. 3. No cord deformity or acute findings seen.  Electronically Signed    By: Richardean Sale M.D.   On: 02/22/2017 17:22   MDM Rules/Calculators/A&P                      Patient presents to the emergency department with complaints of neck pain radiating into the right upper extremity with intermittent right hand paresthesias.  She is nontoxic-appearing, resting comfortably, vitals WNL.  She has no history of trauma or point/focal vertebral tenderness or bony tenderness throughout the right upper extremity to raise concern for fracture/subluxation/dislocation.  No history of IVDU, no fever, erythema, or warmth on exam, do not suspect infectious process such as epidural abscess, septic joint, or cellulitis.  On exam she has symmetric  strength throughout with sensation intact to sharp/dull touch-no focal neurologic deficits-not consistent with acute cord compression.  Do not feel she needs emergent MRI at this time.  Will trial steroids with muscle relaxants to treat muscle spasm and potential cervical radiculopathy.  Will provide Dr. Clarice Pole information for follow-up as this is who her prior neurosurgeon recommended.  Strict return precautions. I discussed treatment plan, need for follow-up, and return precautions with the patient. Provided opportunity for questions, patient confirmed understanding and is in agreement with plan.   Findings and plan of care discussed with supervising physician Dr. Tomi Bamberger who is in agreement.   Final Clinical Impression(s) / ED Diagnoses Final diagnoses:  Neck pain    Rx / DC Orders ED Discharge Orders         Ordered    predniSONE (STERAPRED UNI-PAK 21 TAB) 10 MG (21) TBPK tablet  Daily     12/14/19 2030    methocarbamol (ROBAXIN) 500 MG tablet  Every 8 hours PRN     12/14/19 2030           Amaryllis Dyke, PA-C 12/14/19 2047    Dorie Rank, MD 12/18/19 [phone]

## 2023-04-26 ENCOUNTER — Encounter: Payer: Self-pay | Admitting: Internal Medicine
